# Patient Record
Sex: Male | Born: 1990 | Race: White | Hispanic: No | Marital: Married | Smoking: Never smoker
Health system: Southern US, Community
[De-identification: ages and names within clinical notes are randomized; demographics above are authoritative.]

---
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
## 2009-02-13 ENCOUNTER — Emergency Department (HOSPITAL_COMMUNITY): Admission: EM | Admit: 2009-02-13 | Discharge: 2009-02-13 | Payer: Self-pay | Admitting: Emergency Medicine

## 2010-08-07 ENCOUNTER — Encounter: Payer: Self-pay | Admitting: Family Medicine

## 2010-08-07 ENCOUNTER — Encounter: Payer: Self-pay | Admitting: Pediatrics

## 2010-10-23 LAB — BASIC METABOLIC PANEL
BUN: 11 mg/dL (ref 6–23)
Creatinine, Ser: 1.11 mg/dL (ref 0.4–1.5)
Potassium: 4 mEq/L (ref 3.5–5.1)

## 2010-10-23 LAB — URINALYSIS, ROUTINE W REFLEX MICROSCOPIC
Bilirubin Urine: NEGATIVE
Nitrite: NEGATIVE
Protein, ur: NEGATIVE mg/dL
Specific Gravity, Urine: >1.03 — ABNORMAL HIGH (ref 1.005–1.030)
Urobilinogen, UA: 0.2 mg/dL (ref 0.0–1.0)

## 2010-10-23 LAB — CBC
Hemoglobin: 13.8 g/dL (ref 13.0–17.0)
MCHC: 35.5 g/dL (ref 30.0–36.0)
MCV: 90.6 fL (ref 78.0–100.0)
Platelets: 120 10*3/uL — ABNORMAL LOW (ref 150–400)
RBC: 4.31 MIL/uL (ref 4.22–5.81)
RDW: 12.7 % (ref 11.5–15.5)
WBC: 2.9 10*3/uL — ABNORMAL LOW (ref 4.0–10.5)

## 2010-10-23 LAB — HEPATIC FUNCTION PANEL
AST: 22 U/L (ref 0–37)
Bilirubin, Direct: 0.2 mg/dL (ref 0.0–0.3)
Total Bilirubin: 0.7 mg/dL (ref 0.3–1.2)
Total Protein: 7.2 g/dL (ref 6.0–8.3)

## 2010-10-23 LAB — DIFFERENTIAL
Basophils Absolute: 0 10*3/uL (ref 0.0–0.1)
Eosinophils Absolute: 0 10*3/uL (ref 0.0–0.7)
Lymphocytes Relative: 34 % (ref 12–46)
Lymphs Abs: 1 10*3/uL (ref 0.7–4.0)
Monocytes Absolute: 0.7 10*3/uL (ref 0.1–1.0)

## 2010-11-29 NOTE — Assessment & Plan Note (Signed)
NAME:  Lucas [name], Lucas [name]                 CHART#:  [id number]   DATE:  10/24/2007                       DOB:  02-06-1991   REASON FOR CONSULTATION:  Severe proctalgia/left lower quadrant pain.   HISTORY OF PRESENT ILLNESS:  Mr. Hing is an 20 year old Caucasian  male.  He has had a 37-month history of intermittent left lower quadrant  and proctalgia just prior to and after defecation.  He tells me he has  normal soft brown bowel movements.  He denies any rectal bleeding or  melena.  He complains of a severe pain which he rates 9/10 on pain scale  that occurs and lasts usually 5-10 minutes after a bowel movement.  His  symptoms are quite irregular.  He does not have pain with every bowel  movement.  This has happened on 7 or 8  occasions but he generally has a  bowel movement every day.  He denies any history of nausea, vomiting,  diaphoresis, heartburn, indigestion with his symptoms.  His weight has  remained stable.  He denies any anorexia or early satiety.  He denies  any history of chronic abdominal pain or problems.  He is status post  appendectomy for acute appendicitis approximately 2 years ago.  He has  never had formal GI workup.  He had a CT scan of the abdomen and pelvis  on 10/10/07 which was with contrast and completely normal.  He had blood  work on 10/08/07 which included a normal CBC and normal comprehensive  metabolic panel.   PAST MEDICAL AND SURGICAL HISTORY:  Acute appendicitis requiring  appendectomy by Dr. Lovell Sheehan 2 years ago.   MEDICATIONS:  Denies any medications.   ALLERGIES:  No known drug allergies.   FAMILY HISTORY:  There is no family history of colorectal carcinoma or  inflammatory bowel disease.  Father has history of peptic ulcer disease  and has had his gallbladder removed.  He has hyperlipidemia.  Mother is  alive and healthy.  He has one healthy brother.   SOCIAL HISTORY:  Mr. Schellhase lives with his parents and his 84 year old  brother.  He graduated  from high school in 07/2007.  He will start [employer]  in approximately 4 months. Right now he is unemployed and helping his  parents around the house.  He denies any tobacco, alcohol or drug use.   REVIEW OF SYSTEMS:  See HPI.  GU:  He tells me he has never been  sexually active.  He denies any scrotal or groin pain.  He denies any  history of an anal penetration.   PHYSICAL EXAM:  VITAL SIGNS:  Weight 150 pounds, height 58 inches,  temperature 98 degrees, blood pressure 128/80, pulse 60.  GENERAL:  Mr. Shill is a well-developed, well-nourished Caucasian male  in no acute distress.  HEENT:  Pupils equal, sclerae  nonicteric.  Oropharynx pink and moist without lesions.  NECK:  Supple without  thyromegaly.  HEART:  Regular rate and rhythm.  Normal S1/S2.  No  murmurs, clicks, rubs or gallops.  LUNGS:  Clear to auscultation  bilaterally.  ABDOMEN:  Positive bowel sounds x4.  No bruits  auscultated.  Soft, nontender, nondistended without palpable masses, no  HSM, rebound tenderness or guarding.  RECTAL:  No external lesions  visualized.  Good sphincter tone.  No internal masses  palpated. Small  amount of [place] was obtained which is Hemoccult negative.  Exam  was nontender.  EXTREMITIES:  Without clubbing or edema bilaterally.  SKIN:  Warm and dry without rash or jaundice.   IMPRESSION:  Mr. Peerson is an 20 year old Caucasian male with a 23-month  history of intermittent severe proctalgia and left lower quadrant pain  that immediately precedes defecation and is resolved within 5-10 minutes  post defecation.  Symptoms are suspicious for anal fissure or proctalgia  fugax.  There are no alarm features. His symptoms are not with typical  IBS symptoms and his CT of abdomen and pelvis with contrast was  reassuring.   PLAN:  1. Anusol HC suppositories one per rectum b.i.d. #20 with no refills.  2. Stool softeners every day.  3. I would like him to keep a stool and pain diary and we have  given      this to take home.  He is going to record when he has bowel      movements as well as when he has symptoms and the consistency of      his bowel movements and he will call if he develops severe pain      otherwise will have him follow up with Dr. Kassie Mends in 6-8      weeks.   Thank you Dr. Nobie Putnam for allowing [place] to participate in the care of Mr.  Mikami.       Lorenza Burton, N.P.  Electronically Signed     Kassie Mends, M.D.  Electronically Signed    KJ/MEDQ  D:  10/24/2007  T:  10/24/2007  Job:  [id number]   cc:   Patrica Duel, M.D.

## 2010-12-02 NOTE — Op Note (Signed)
Lucas, [name] NO.:  [id number]   MEDICAL RECORD NO.:  [id number]          PATIENT TYPE:  OBV   LOCATION:  A328                          FACILITY:  [facility]   PHYSICIAN:  Dalia Heading, M.D.  DATE OF BIRTH:  1990-09-21   DATE OF PROCEDURE:  10/10/2005  DATE OF DISCHARGE:  10/10/2005                                 OPERATIVE REPORT   PREOPERATIVE DIAGNOSIS:  Acute appendicitis.   POSTOPERATIVE DIAGNOSIS:  Acute appendicitis.   PROCEDURE:  Laparoscopic appendectomy.   SURGEON:  Dalia Heading, M.D.   ANESTHESIA:  General endotracheal.   INDICATIONS:  The patient is a 20 year old white male who presents with 2-  day history of worsening right lower quadrant abdominal pain.  CT scan of  the abdomen and pelvis was performed which revealed acute appendicitis  without perforation.  The patient now comes to the operating room for  laparoscopic appendectomy.  Risks and benefits of the procedure including  bleeding, infection, and the possibility of an open procedure were fully  explained to the patient, who gave informed consent.   PROCEDURE NOTE:  The patient was placed in the supine position.  After  induction of general endotracheal anesthesia, the abdomen was prepped and  draped using the usual sterile technique with Betadine.  Surgical site  confirmation was performed.   A supraumbilical incision was made down to fascia.  A Veress needle was  introduced into the abdominal cavity and confirmation of placement was done  using the saline drop test.  The abdomen was then insufflated to 16 mmHg  pressure.  An 11-mm trocar was introduced into the abdominal cavity under  direct visualization without difficulty.  The patient was placed in deeper  Trendelenburg position; and additional 11-mm trocar was placed in suprapubic  region; and 5-mm trocar was placed in the left lower quadrant region.  The  appendix was visualized and noted be acutely inflamed.  The  mesoappendix was  divided using the harmonic scalpel.  A vascular Endo-GIA was placed across  the base the appendix and fired.  The appendix was removed using the  EndoCatch bag.  The right lower quadrant was copiously irrigated with normal  saline.  All fluid and air were then evacuated from the abdominal cavity  prior to move the trocars.   All wounds were irrigated with normal saline.  All wounds were injected with  0.5% Sensorcaine.  The supraumbilical fascia as well as suprapubic fascia  reapproximated using #0 Vicryl interrupted sutures.  All skin incisions were  closed using staples.  Betadine ointment and dry sterile dressings were  applied.   All tape and needle counts were correct at the end of the procedure.  The  patient was extubated in the operating room and went back to recovery room  awake in stable condition.   COMPLICATIONS:  None.   SPECIMEN:  Appendix.   BLOOD LOSS:  Minimal.      Dalia Heading, M.D.  Electronically Signed     MAJ/MEDQ  D:  10/10/2005  T:  10/10/2005  Job:  [id number]  cc:   Patrica Duel, M.D.  Fax: [phone]

## 2019-03-24 ENCOUNTER — Emergency Department (HOSPITAL_COMMUNITY): Payer: 59

## 2019-03-24 ENCOUNTER — Other Ambulatory Visit: Payer: Self-pay

## 2019-03-24 ENCOUNTER — Encounter (HOSPITAL_COMMUNITY): Payer: Self-pay | Admitting: Emergency Medicine

## 2019-03-24 ENCOUNTER — Emergency Department (HOSPITAL_COMMUNITY)
Admission: EM | Admit: 2019-03-24 | Discharge: 2019-03-24 | Disposition: A | Discharge status: Home / Self Care | Payer: 59 | Attending: Emergency Medicine | Admitting: Emergency Medicine

## 2019-03-24 DIAGNOSIS — W268XXA Contact with other sharp object(s), not elsewhere classified, initial encounter: Secondary | ICD-10-CM | POA: Diagnosis not present

## 2019-03-24 DIAGNOSIS — Z23 Encounter for immunization: Secondary | ICD-10-CM | POA: Diagnosis not present

## 2019-03-24 DIAGNOSIS — Y9389 Activity, other specified: Secondary | ICD-10-CM | POA: Insufficient documentation

## 2019-03-24 DIAGNOSIS — Y990 Civilian activity done for income or pay: Secondary | ICD-10-CM | POA: Diagnosis not present

## 2019-03-24 DIAGNOSIS — S61111A Laceration without foreign body of right thumb with damage to nail, initial encounter: Secondary | ICD-10-CM | POA: Diagnosis not present

## 2019-03-24 DIAGNOSIS — Y9289 Other specified places as the place of occurrence of the external cause: Secondary | ICD-10-CM | POA: Insufficient documentation

## 2019-03-24 DIAGNOSIS — S6991XA Unspecified injury of right wrist, hand and finger(s), initial encounter: Secondary | ICD-10-CM | POA: Diagnosis present

## 2019-03-24 MED ORDER — IBUPROFEN 600 MG PO TABS: 600.0000 mg | ORAL_TABLET | Freq: Four times a day (QID) | ORAL | 0 refills | Status: AC | PRN

## 2019-03-24 MED ORDER — TETANUS-DIPHTH-ACELL PERTUSSIS 5-2.5-18.5 LF-MCG/0.5 IM SUSP
0.5000 mL | Freq: Once | INTRAMUSCULAR | Status: AC
  Administered 2019-03-24: 17:00:00 0.5 mL via INTRAMUSCULAR
  Filled 2019-03-24: qty 0.5

## 2019-03-24 MED ORDER — BACITRACIN-NEOMYCIN-POLYMYXIN 400-5-5000 EX OINT
TOPICAL_OINTMENT | Freq: Once | CUTANEOUS | Status: DC
  Filled 2019-03-24: qty 1

## 2019-03-24 MED ORDER — LIDOCAINE HCL (PF) 2 % IJ SOLN: 5.0000 mL | Freq: Once | INTRAMUSCULAR | Status: DC

## 2019-03-24 MED ORDER — LIDOCAINE HCL (PF) 1 % IJ SOLN
INTRAMUSCULAR | Status: AC
  Administered 2019-03-24: 2 mL
  Filled 2019-03-24: qty 2

## 2019-03-24 NOTE — ED Provider Notes (Signed)
[facility] EMERGENCY DEPARTMENT Provider Note   CSN: [id number] Arrival date & time: 03/24/19  1409     History   Chief Complaint Chief Complaint  Patient presents with  . Laceration    HPI Lucas [name] is a 28 y.o. male with no significant past medical history presenting with a laceration to his right distal thumb which occurred yesterday morning at work.  He caught the edge of his thumb on a piece of sheet metal.  Patient is right-handed.  He has kept the wound clean using hydrogen peroxide, antibiotic ointment and dressings.  He has persistent pain at the site.  He is not current with his tetanus.  He denies weakness or numbness in the fingertip.     HPI  History reviewed. No pertinent past medical history.  There are no active problems to display for this patient.   Past Surgical History:  Procedure Laterality Date  . APPENDECTOMY          Home Medications    Prior to Admission medications   Medication Sig Start Date End Date Taking? Authorizing Provider  ibuprofen (ADVIL) 600 MG tablet Take 1 tablet (600 mg total) by mouth every 6 (six) hours as needed. 03/24/19   Evalee Jefferson, [name]    Family History History reviewed. No pertinent family history.  Social History Social History   Tobacco Use  . Smoking status: Never Smoker  . Smokeless tobacco: Never Used  Substance Use Topics  . Alcohol use: Never    Frequency: Never  . Drug use: Never     Allergies   Patient has no known allergies.   Review of Systems Review of Systems  Constitutional: Negative for chills and fever.  Respiratory: Negative.   Cardiovascular: Negative.   Gastrointestinal: Negative.   Musculoskeletal: Negative.   Skin: Positive for wound.  Neurological: Negative for weakness and numbness.     Physical Exam Updated Vital Signs BP (!) 143/96 (BP Location: Left Arm)   Pulse 72   Temp 98.3 F (36.8 C) (Oral)   Resp 18   Ht 5\' 9"  (1.753 m)   Wt 79.4 kg   SpO2 100%   BMI  25.84 kg/m   Physical Exam Constitutional:      Appearance: He is well-developed.  HENT:     Head: Normocephalic.  Cardiovascular:     Rate and Rhythm: Normal rate.  Pulmonary:     Effort: Pulmonary effort is normal.  Musculoskeletal:        General: Tenderness present.  Skin:    General: Skin is warm and dry.     Findings: Laceration present.     Comments: Superficial laceration right distal fingertip with partial nail plate involvement. Laceration through the plate with distal plate appears to pointed downward through the laceration.  Laceration approx 0.25 cm, distal "dimpling" secondary to nail plate dislocation.  Remaining plate intact and injury free.  Neurological:     Mental Status: He is alert and oriented to person, place, and time.     Sensory: No sensory deficit.      ED Treatments / Results  Labs (all labs ordered are listed, but only abnormal results are displayed) Labs Reviewed - No data to display  EKG None  Radiology Dg Finger Thumb Right  Result Date: 03/24/2019 CLINICAL DATA:  Laceration around right thumbnail while working with metal equipment. EXAM: RIGHT THUMB 2+V COMPARISON:  None. FINDINGS: There is no evidence of fracture or dislocation. There is no evidence of arthropathy  or other focal bone abnormality. Soft tissue laceration overlying the thumbnail is identified. No underlying fracture. No radio-opaque foreign bodies. IMPRESSION: 1. Soft tissue laceration is noted involving the distal thumb adjacent to thumbnail. No underlying acute bone abnormality. Electronically Signed   By: Signa Kellaylor  Stroud M.D.   On: 03/24/2019 15:29    Procedures Procedures (including critical care time)  LACERATION REPAIR Performed by: Burgess AmorJulie Lasonia Casino Authorized by: Burgess AmorJulie Hanzel Pizzo Consent: Verbal consent obtained. Risks and benefits: risks, benefits and alternatives were discussed Consent given by: patient Patient identity confirmed: provided demographic data Prepped and Draped  in normal sterile fashion Wound explored  Laceration Location: right distal thumb  Laceration Length: 0.25 cm  No Foreign Bodies seen or palpated  Anesthesia: digital block  Local anesthetic: lidocaine 1% without epinephrine  Anesthetic total: 3 ml  Irrigation method: syringe Amount of cleaning: standard  Skin closure: none required.  Wound well approximated once fractured nail edge excised and removed from wound.  Number of sutures: n/a  Technique: n/a  Patient tolerance: Patient tolerated the procedure well with no immediate complication   Medications Ordered in ED Medications  lidocaine (XYLOCAINE) 2 % injection 5 mL (has no administration in time range)  neomycin-bacitracin-polymyxin (NEOSPORIN) ointment packet (has no administration in time range)  lidocaine (PF) (XYLOCAINE) 1 % injection (2 mLs  Given 03/24/19 1703)  Tdap (BOOSTRIX) injection 0.5 mL (0.5 mLs Intramuscular Given 03/24/19 1703)     Initial Impression / Assessment and Plan / ED Course  I have reviewed the triage vital signs and the nursing notes.  Pertinent labs & imaging results that were available during my care of the patient were reviewed by me and considered in my medical decision making (see chart for details).       Imaging reviewed and negative for bony injury. Tetanus updated.  Patient was given a bulky dressing including a finger splint to protect the tip after application of antibiotic ointment.  Twice daily soap and water wash, patient advised to discontinue using hydrogen peroxide.  Continued antibiotic ointment.  PRN follow-up anticipated.  Final Clinical Impressions(s) / ED Diagnoses   Final diagnoses:  Laceration of right thumb without foreign body with damage to nail, initial encounter    ED Discharge Orders         Ordered    ibuprofen (ADVIL) 600 MG tablet  Every 6 hours PRN     03/24/19 1705           Burgess Amordol, Caylynn Minchew, [name] 03/24/19 1741    Raeford RazorKohut, Stephen, MD 03/24/19  2109

## 2019-03-24 NOTE — ED Triage Notes (Signed)
PT c/o laceration around thumb nail while working with a piece of metal equipment yesterday.

## 2019-03-24 NOTE — Discharge Instructions (Addendum)
Keep your wound clean and dry between twice daily washing with soap and water.  Apply a new layer of antibiotic ointment after washing. Keep the nail edge trimmed as it continues to grow out.  Wear the finger splint to protect the tip as it continues to heal. Your tetanus has been updated today. You may find ibuprofen helpful for pain relief.

## 2020-08-02 IMAGING — DX DG FINGER THUMB 2+V*R*
3 series · 3 of 3 positions shown · non-contrast
Comparison: None.

CLINICAL DATA: Laceration around right thumbnail while working with
metal equipment.

EXAM:
RIGHT THUMB 2+V

[finger ap]
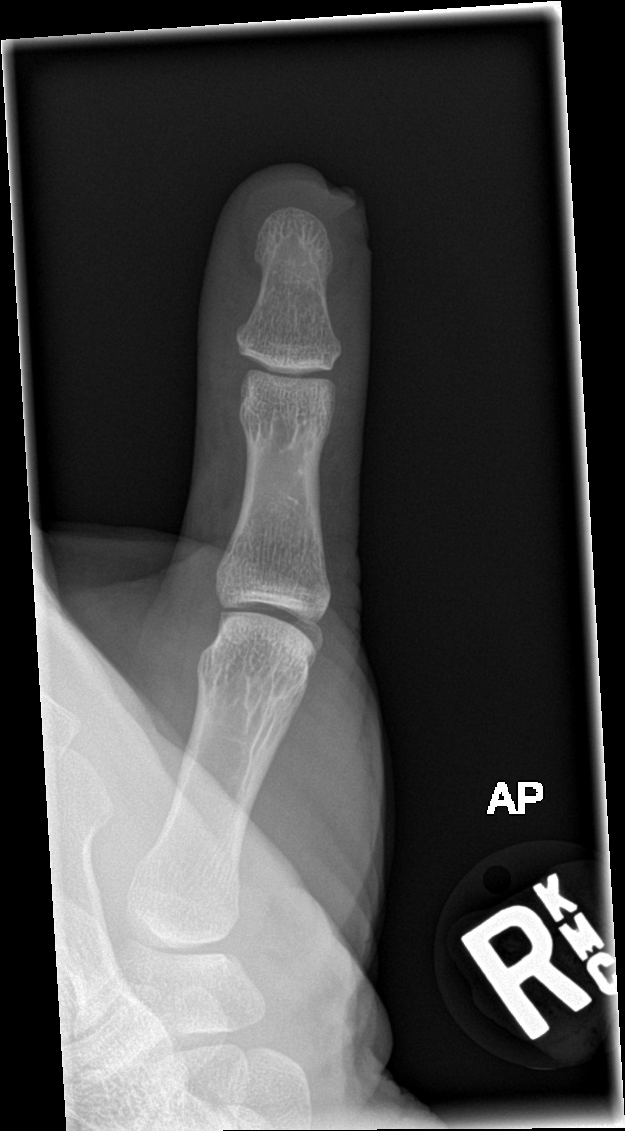

[finger obl]
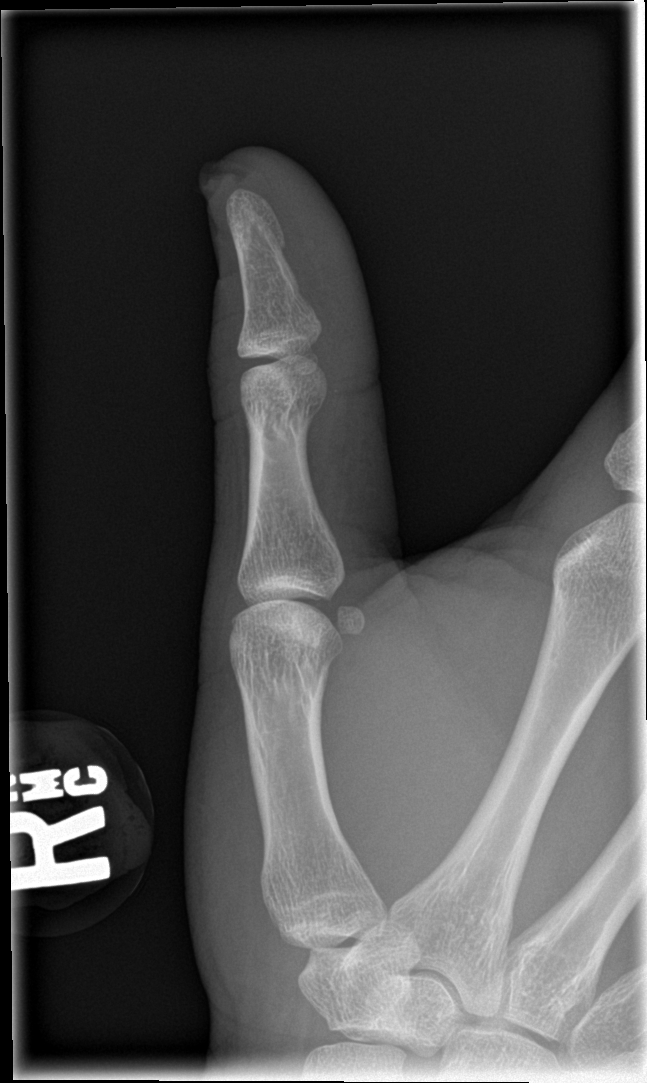

[finger lat]
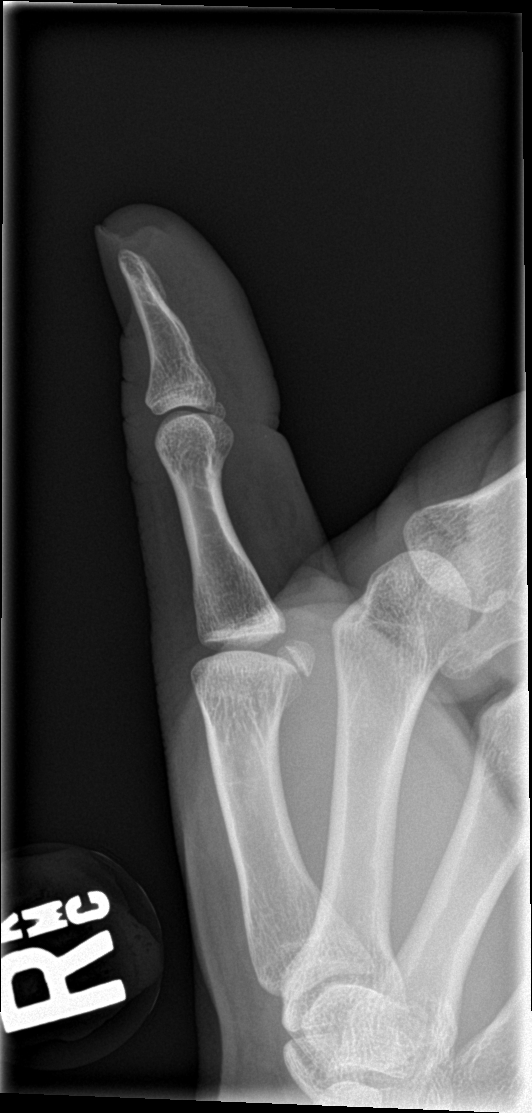

[3 of 3 positions shown; findings below may reference images not displayed]

FINDINGS: There is no evidence of fracture or dislocation. There is no
evidence of arthropathy or other focal bone abnormality. Soft tissue
laceration overlying the thumbnail is identified. No underlying
fracture. No radio-opaque foreign bodies.
IMPRESSION: 1. Soft tissue laceration is noted involving the distal thumb
adjacent to thumbnail. No underlying acute bone abnormality.

## 2020-11-01 ENCOUNTER — Encounter: Payer: Self-pay | Admitting: Emergency Medicine

## 2020-11-01 ENCOUNTER — Other Ambulatory Visit: Payer: Self-pay

## 2020-11-01 ENCOUNTER — Ambulatory Visit
Admission: EM | Admit: 2020-11-01 | Discharge: 2020-11-01 | Disposition: A | Discharge status: Home / Self Care | Payer: 59 | Attending: Internal Medicine | Admitting: Internal Medicine

## 2020-11-01 ENCOUNTER — Ambulatory Visit (INDEPENDENT_AMBULATORY_CARE_PROVIDER_SITE_OTHER): Payer: 59

## 2020-11-01 DIAGNOSIS — R059 Cough, unspecified: Secondary | ICD-10-CM | POA: Diagnosis not present

## 2020-11-01 DIAGNOSIS — J0410 Acute tracheitis without obstruction: Secondary | ICD-10-CM | POA: Diagnosis not present

## 2020-11-01 DIAGNOSIS — R079 Chest pain, unspecified: Secondary | ICD-10-CM | POA: Diagnosis not present

## 2020-11-01 MED ORDER — PREDNISONE 20 MG PO TABS: 20.0000 mg | ORAL_TABLET | Freq: Every day | ORAL | 0 refills | Status: AC

## 2020-11-01 NOTE — ED Provider Notes (Signed)
[facility]    CSN: [id number] Arrival date & time: 11/01/20  1039      History   Chief Complaint No chief complaint on file.   HPI Lucas [name] is a 30 y.o. male who presents with onset of cough x 3 days. Cough provokes chest pain and his daughter had similar symptoms when diagnosed with pneumonia. His daughter had a cough for several weeks. Pt denies nose congestion, HA, SOB or body aches.   History reviewed. No pertinent past medical history.  There are no problems to display for this patient.   Past Surgical History:  Procedure Laterality Date  . APPENDECTOMY         Home Medications    Prior to Admission medications   Medication Sig Start Date End Date Taking? Authorizing Provider  predniSONE (DELTASONE) 20 MG tablet Take 1 tablet (20 mg total) by mouth daily with breakfast. 11/01/20  Yes [name], [name], [name]  ibuprofen (ADVIL) 600 MG tablet Take 1 tablet (600 mg total) by mouth every 6 (six) hours as needed. 03/24/19   Burgess Amor, [name]    Family History Family History  Problem Relation Age of Onset  . Healthy Mother   . Diabetes Father     Social History Social History   Tobacco Use  . Smoking status: Never Smoker  . Smokeless tobacco: Never Used  Vaping Use  . Vaping Use: Never used  Substance Use Topics  . Alcohol use: Never  . Drug use: Never     Allergies   Patient has no known allergies.   Review of Systems Review of Systems  Constitutional: Negative for appetite change, chills, diaphoresis, fatigue and fever.  HENT: Negative for congestion, ear discharge, ear pain, postnasal drip, rhinorrhea and sore throat.   Eyes: Negative for discharge.  Respiratory: Positive for cough. Negative for chest tightness and shortness of breath.   Cardiovascular: Negative for chest pain.  Musculoskeletal: Negative for myalgias.  Skin: Negative for rash.  Neurological: Negative for headaches.     Physical Exam Triage Vital  Signs ED Triage Vitals  Enc Vitals Group     BP 11/01/20 1049 (!) 157/88     Pulse Rate 11/01/20 1049 (!) 111     Resp 11/01/20 1049 18     Temp 11/01/20 1049 99.5 F (37.5 C)     Temp Source 11/01/20 1049 Oral     SpO2 11/01/20 1049 98 %     Weight --      Height --      Head Circumference --      Peak Flow --      Pain Score 11/01/20 1051 2     Pain Loc --      Pain Edu? --      Excl. in GC? --    No data found.  Updated Vital Signs BP (!) 157/88 (BP Location: Right Arm)   Pulse (!) 111   Temp 99.5 F (37.5 C) (Oral)   Resp 18   SpO2 98%   Visual Acuity Right Eye Distance:   Left Eye Distance:   Bilateral Distance:    Right Eye Near:   Left Eye Near:    Bilateral Near:     Physical Exam Physical Exam Vitals signs and nursing note reviewed.  Constitutional:      General: he is not in acute distress.    Appearance: Normal appearance. he is not ill-appearing, toxic-appearing or diaphoretic.  HENT:     Head: Normocephalic.  Right Ear: Tympanic membrane, ear canal and external ear normal.     Left Ear: Tympanic membrane, ear canal and external ear normal.     Nose: Nose normal.     Mouth/Throat:     Mouth: Mucous membranes are moist.  Eyes:     General: No scleral icterus.       Right eye: No discharge.        Left eye: No discharge.     Conjunctiva/sclera: Conjunctivae normal.  Neck:     Musculoskeletal: Neck supple. No neck rigidity.  Cardiovascular:     Rate and Rhythm: Normal rate and regular rhythm.     Heart sounds: No murmur.  Pulmonary:     Effort: Pulmonary effort is normal.     Breath sounds: Normal breath sounds. No chest wall tenderness provoked.  Musculoskeletal: Normal range of motion.  Lymphadenopathy:     Cervical: No cervical adenopathy.  Skin:    General: Skin is warm and dry.     Coloration: Skin is not jaundiced.     Findings: No rash.  Neurological:     Mental Status: he is alert and oriented to person, place, and time.      Gait: Gait normal.  Psychiatric:        Mood and Affect: Mood normal.        Behavior: Behavior normal.        Thought Content: Thought content normal.        Judgment: Judgment normal.     UC Treatments / Results  Labs (all labs ordered are listed, but only abnormal results are displayed) Labs Reviewed - No data to display  EKG   Radiology No results found. CLINICAL DATA:  Cough, chest pain  EXAM: CHEST - 2 VIEW  COMPARISON:  None.  FINDINGS: The heart size and mediastinal contours are within normal limits. Both lungs are clear. The visualized skeletal structures are unremarkable.  IMPRESSION: No active cardiopulmonary disease.   Electronically Signed   By: Elige Ko   On: 11/01/2020 11:13 Procedures Procedures (including critical care time)  Medications Ordered in UC Medications - No data to display  Initial Impression / Assessment and Plan / UC Course  I have reviewed the triage vital signs and the nursing notes. Pertinent  imaging results that were available during my care of the patient were reviewed by me and considered in my medical decision making (see chart for details). Cough which is causing irritation of his trachea. I reassured him that he does not have pneumonia. I placed him on Deltasone. See instructions Final Clinical Impressions(s) / UC Diagnoses   Final diagnoses:  Cough  Tracheitis     Discharge Instructions     Your chest xray is completely normal with no signs of pneumonia. Your pain is more likely irritation of your breathing wind pipe called trachea. I will have you try some Prednisone to help with that.  Monitor your temperature and if you have body aches, headaches and fever over 100.5, you may have the flu or covid, please find a clinic you have have rapid testing if you want results right away. Ours takes 24 hours to result.     ED Prescriptions    Medication Sig Dispense Auth. Provider   predniSONE (DELTASONE) 20  MG tablet Take 1 tablet (20 mg total) by mouth daily with breakfast. 5 tablet [name], [name], [name]     PDMP not reviewed this encounter.   Garey Ham, [name] 11/05/20 1048

## 2020-11-01 NOTE — ED Triage Notes (Signed)
Cough since Friday states every time he coughs he has a pain in his chest area. States his daughter was diagnosed with pneumonia over the weekend with the same symptoms that he is having.

## 2020-11-01 NOTE — Discharge Instructions (Addendum)
Your chest xray is completely normal with no signs of pneumonia. Your pain is more likely irritation of your breathing wind pipe called trachea. I will have you try some Prednisone to help with that.  Monitor your temperature and if you have body aches, headaches and fever over 100.5, you may have the flu or covid, please find a clinic you have have rapid testing if you want results right away. Ours takes 24 hours to result.

## 2022-03-13 IMAGING — DX DG CHEST 2V
2 series · 2 of 2 positions shown · non-contrast
Comparison: None.

CLINICAL DATA: Cough, chest pain

EXAM:
CHEST - 2 VIEW

[chest pa]
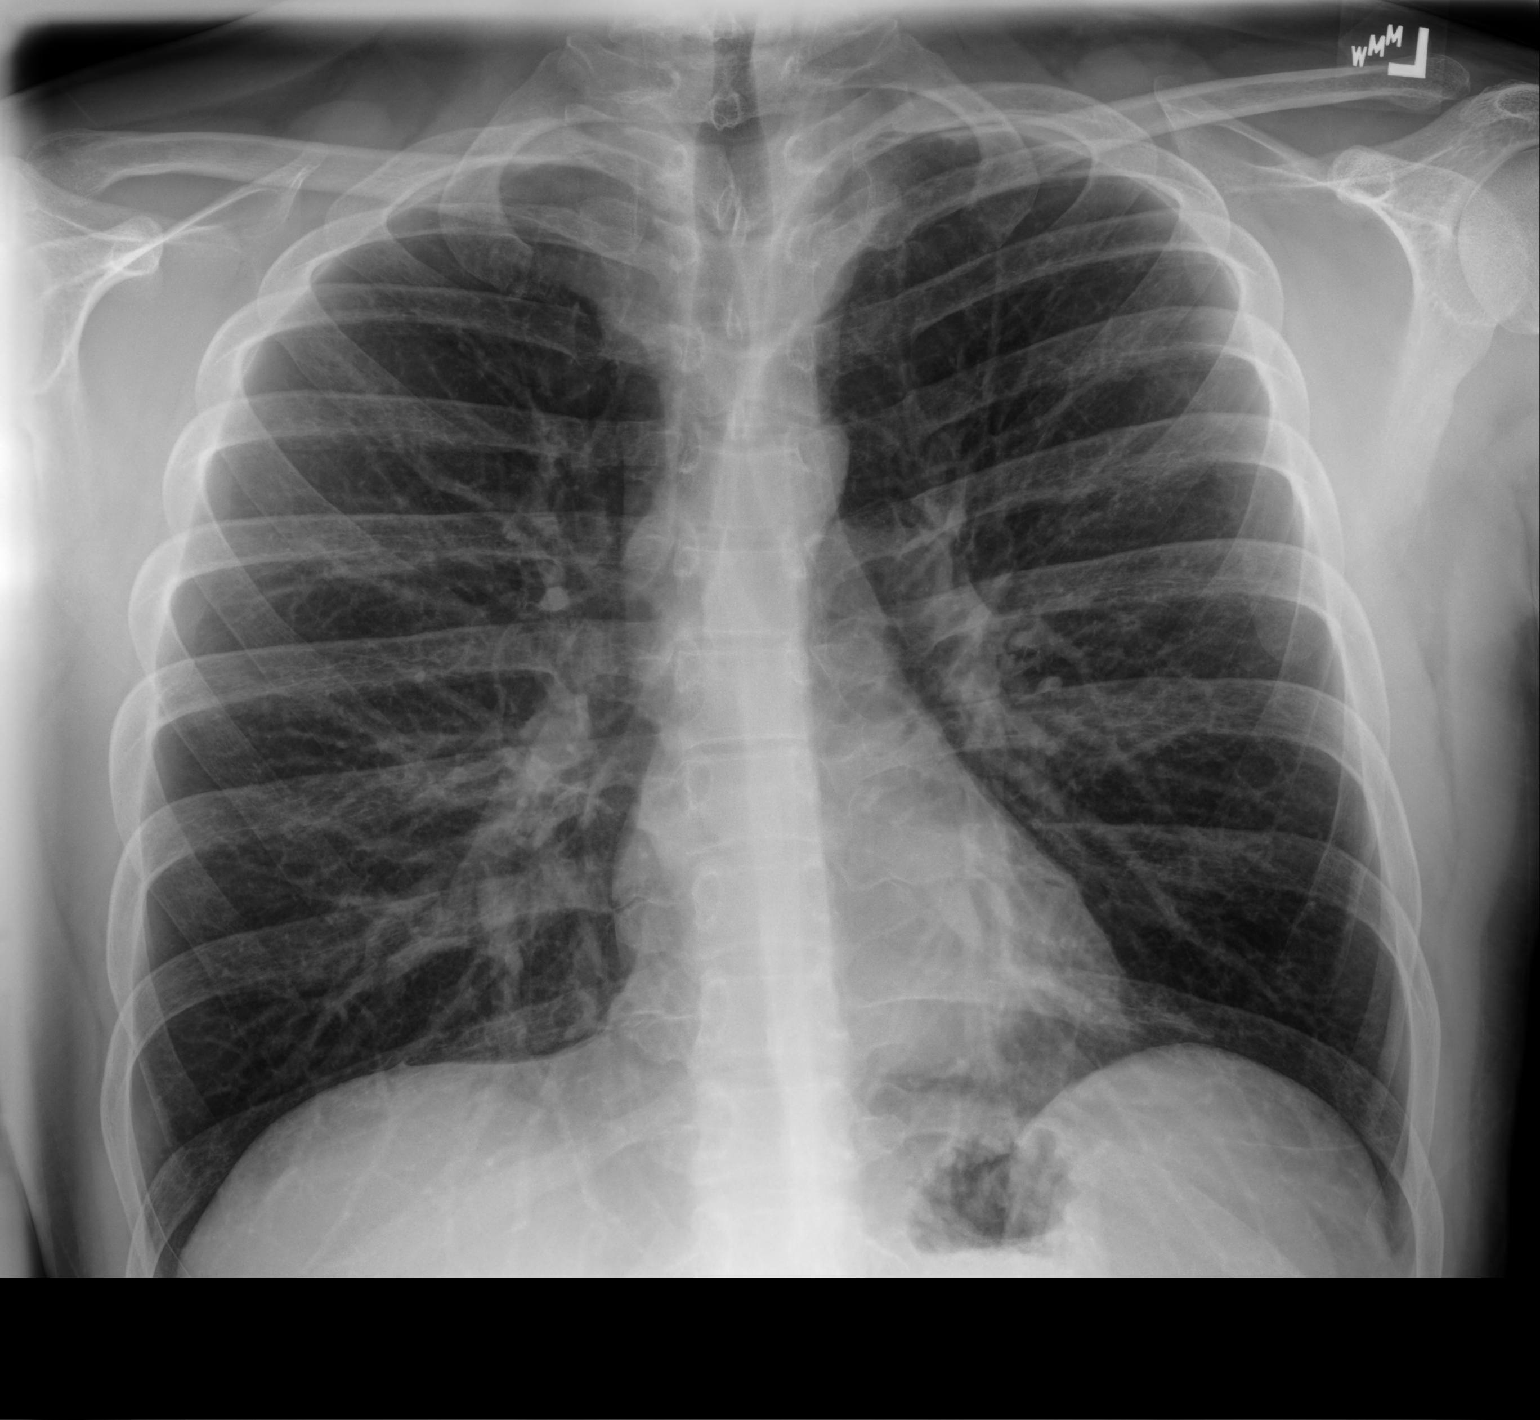

[chest lat]
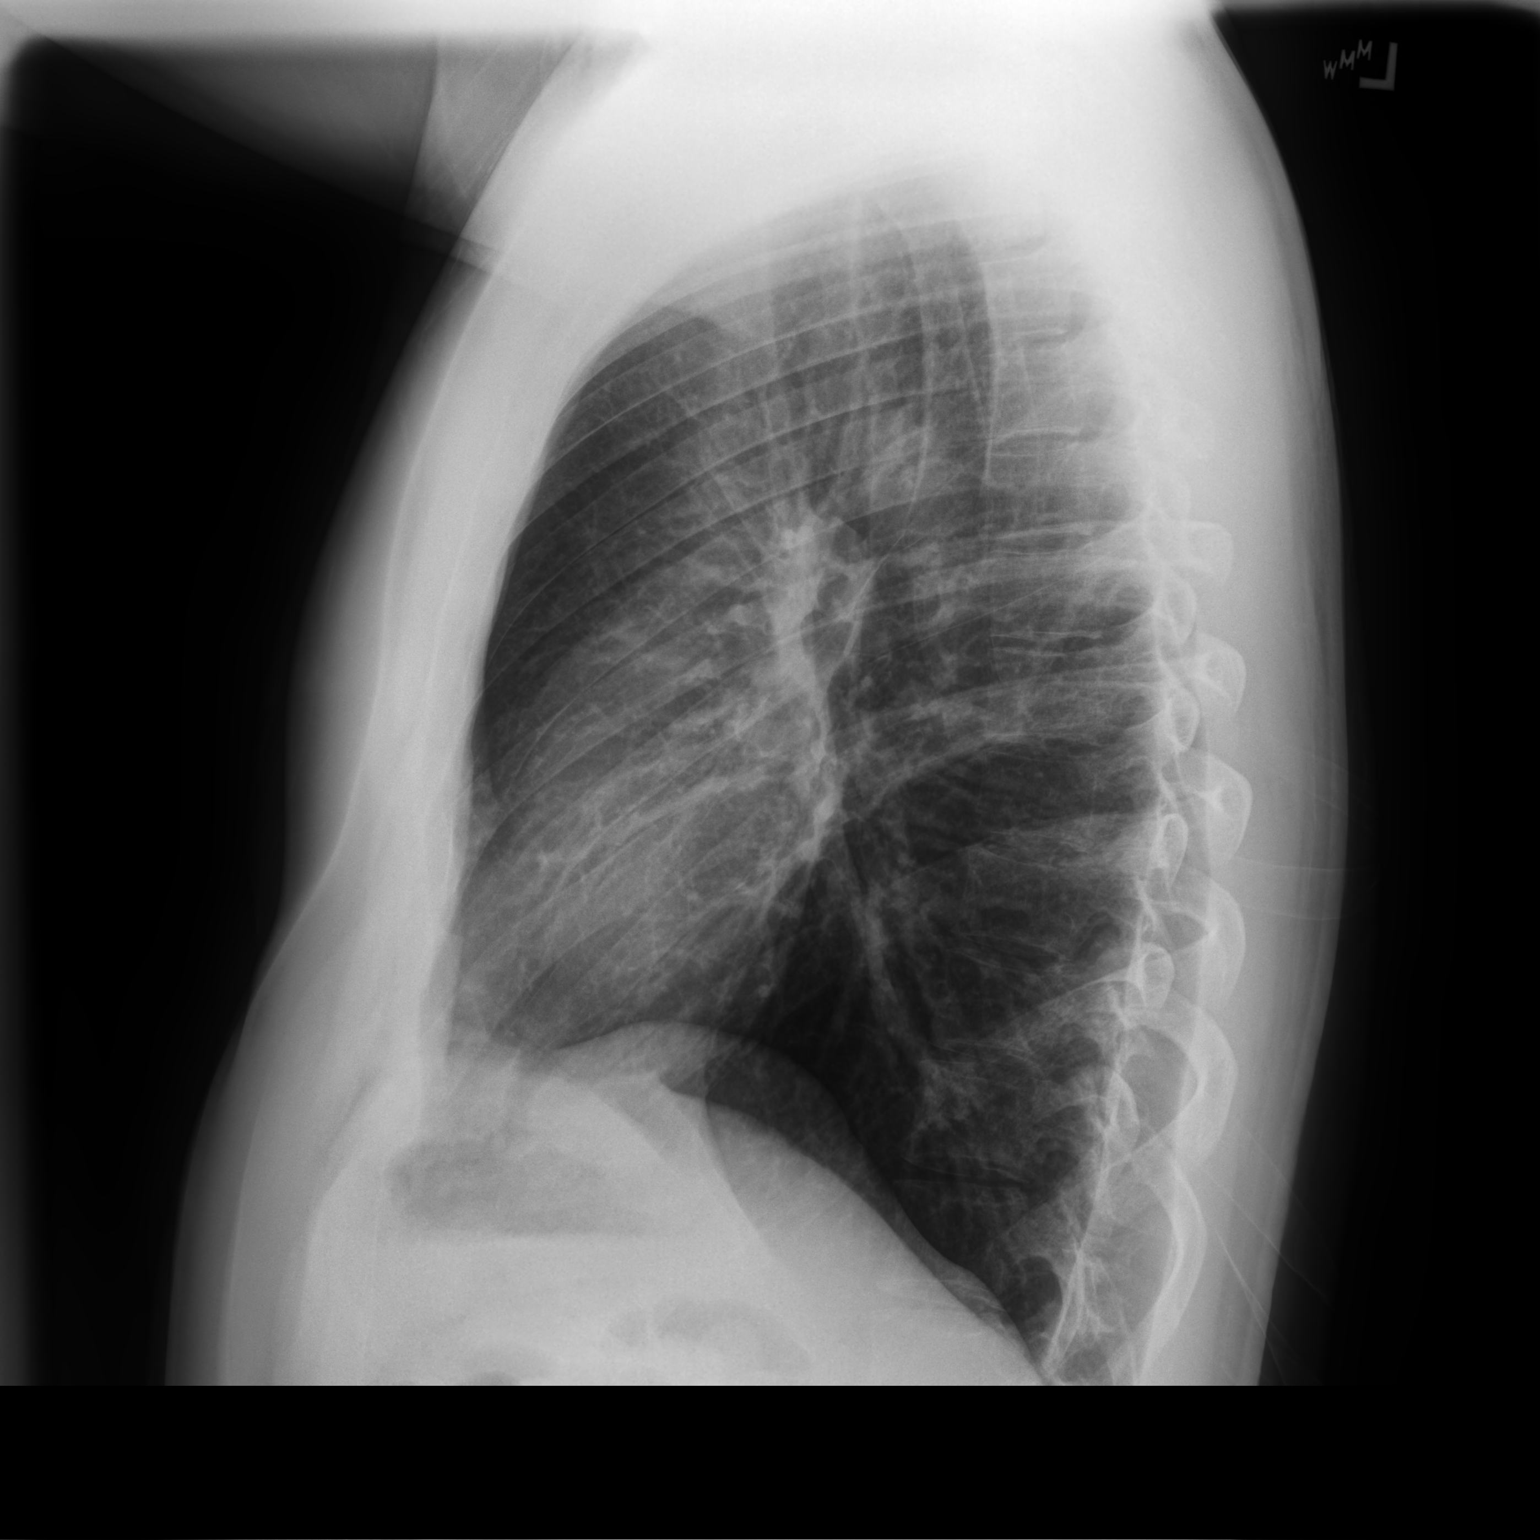

[2 of 2 positions shown; findings below may reference images not displayed]

FINDINGS: The heart size and mediastinal contours are within normal limits.
Both lungs are clear. The visualized skeletal structures are
unremarkable.
IMPRESSION: No active cardiopulmonary disease.
